# Patient Record
Sex: Male | Born: 2000 | Race: Black or African American | Hispanic: No | Marital: Single
Health system: Southern US, Community
[De-identification: ages and names within clinical notes are randomized; demographics above are authoritative.]

---
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
## 2023-01-05 ENCOUNTER — Ambulatory Visit
Admission: EM | Admit: 2023-01-05 | Discharge: 2023-01-05 | Disposition: A | Discharge status: Home / Self Care | Payer: Medicaid Other | Attending: Nurse Practitioner | Admitting: Nurse Practitioner

## 2023-01-05 DIAGNOSIS — Z202 Contact with and (suspected) exposure to infections with a predominantly sexual mode of transmission: Secondary | ICD-10-CM | POA: Diagnosis present

## 2023-01-05 DIAGNOSIS — Z113 Encounter for screening for infections with a predominantly sexual mode of transmission: Secondary | ICD-10-CM | POA: Diagnosis present

## 2023-01-05 MED ORDER — DOXYCYCLINE HYCLATE 100 MG PO CAPS: 100.0000 mg | ORAL_CAPSULE | Freq: Two times a day (BID) | ORAL | 0 refills | Status: AC

## 2023-01-05 NOTE — Discharge Instructions (Signed)
Start doxycycline to daily for 7 days Clinical contact you with results of the STD testing done are positive Follow-up with your PCP in 2 days for recheck Please go to the emergency room if you have any worsening symptoms

## 2023-01-05 NOTE — ED Provider Notes (Signed)
[facility]    CSN: [id number] Arrival date & time: 01/05/23  1911      History   Chief Complaint Chief Complaint  Patient presents with   Exposure to STD    HPI Daniel Mosley is a 22 y.o. male for evaluation of exposure to chlamydia.  Patient states he was recently informed he was exposed to chlamydia.  He currently denies any symptoms including penile discharge, dysuria, testicular pain or swelling.  He has not been treated for chlamydia in the past.  No other concerns at this time.   Exposure to STD    History reviewed. No pertinent past medical history.  There are no problems to display for this patient.   History reviewed. No pertinent surgical history.     Home Medications    Prior to Admission medications   Medication Sig Start Date End Date Taking? Authorizing Provider  doxycycline (VIBRAMYCIN) 100 MG capsule Take 1 capsule (100 mg total) by mouth 2 (two) times daily for 7 days. 01/05/23 01/12/23 Yes Melynda Ripple, NP  mupirocin ointment (BACTROBAN) 2 % Apply 1 application topically 2 (two) times daily. 06/26/20   Vanessa Kick, MD  diphenhydrAMINE (BENADRYL) 25 MG tablet Take 1 tablet (25 mg total) by mouth every 6 (six) hours as needed. 10/06/19 06/26/20  Brent Bulla, MD    Family History Family History  Problem Relation Age of Onset   Healthy Mother    Healthy Father     Social History Social History   Tobacco Use   Smoking status: Passive Smoke Exposure - Never Smoker   Smokeless tobacco: Never  Vaping Use   Vaping Use: Never used  Substance Use Topics   Alcohol use: No   Drug use: Never     Allergies   Patient has no known allergies.   Review of Systems Review of Systems  Genitourinary:        STD testing     Physical Exam Triage Vital Signs ED Triage Vitals  Enc Vitals Group     BP 01/05/23 1924 127/77     Pulse Rate 01/05/23 1924 83     Resp 01/05/23 1924 20     Temp 01/05/23 1924 98 F (36.7 C)     Temp Source  01/05/23 1924 Oral     SpO2 01/05/23 1924 96 %     Weight 01/05/23 1923 195 lb (88.5 kg)     Height 01/05/23 1923 5\' 9"  (1.753 m)     Head Circumference --      Peak Flow --      Pain Score 01/05/23 1923 0     Pain Loc --      Pain Edu? --      Excl. in [place]? --    No data found.  Updated Vital Signs BP 127/77 (BP Location: Right Arm)   Pulse 83   Temp 98 F (36.7 C) (Oral)   Resp 20   Ht 5\' 9"  (1.753 m)   Wt 195 lb (88.5 kg)   SpO2 96%   BMI 28.80 kg/m   Visual Acuity Right Eye Distance:   Left Eye Distance:   Bilateral Distance:    Right Eye Near:   Left Eye Near:    Bilateral Near:     Physical Exam Vitals and nursing note reviewed.  Constitutional:      Appearance: Normal appearance.  HENT:     Head: Normocephalic and atraumatic.  Eyes:     Pupils: Pupils  are equal, round, and reactive to light.  Cardiovascular:     Rate and Rhythm: Normal rate.  Pulmonary:     Effort: Pulmonary effort is normal.  Skin:    General: Skin is warm and dry.  Neurological:     General: No focal deficit present.     Mental Status: He is alert and oriented to person, place, and time.  Psychiatric:        Mood and Affect: Mood normal.        Behavior: Behavior normal.      UC Treatments / Results  Labs (all labs ordered are listed, but only abnormal results are displayed) Labs Reviewed  CYTOLOGY, (ORAL, ANAL, URETHRAL) ANCILLARY ONLY    EKG   Radiology No results found.  Procedures Procedures (including critical care time)  Medications Ordered in UC Medications - No data to display  Initial Impression / Assessment and Plan / UC Course  I have reviewed the triage vital signs and the nursing notes.  Pertinent labs & imaging results that were available during my care of the patient were reviewed by me and considered in my medical decision making (see chart for details).     Start doxycycline 100 mg twice daily for 7 days given exposure to chlamydia STD  testing is ordered and will contact for any positive results.  Patient declined blood work Follow-up with PCP 2 to 3 days as needed ER precautions reviewed and patient verbalized understanding Final Clinical Impressions(s) / UC Diagnoses   Final diagnoses:  Exposure to chlamydia  Screening examination for STD (sexually transmitted disease)     Discharge Instructions      Start doxycycline to daily for 7 days Clinical contact you with results of the STD testing done are positive Follow-up with your PCP in 2 days for recheck Please go to the emergency room if you have any worsening symptoms   ED Prescriptions     Medication Sig Dispense Auth. Provider   doxycycline (VIBRAMYCIN) 100 MG capsule Take 1 capsule (100 mg total) by mouth 2 (two) times daily for 7 days. 14 capsule Melynda Ripple, NP      PDMP not reviewed this encounter.   Melynda Ripple, NP 01/05/23 [phone]

## 2023-01-05 NOTE — ED Triage Notes (Signed)
Pt states that he was exposed to std.

## 2023-01-06 LAB — CYTOLOGY, (ORAL, ANAL, URETHRAL) ANCILLARY ONLY
Chlamydia: POSITIVE — AB
Comment: NEGATIVE
Comment: NEGATIVE
Comment: NORMAL
Neisseria Gonorrhea: NEGATIVE
Trichomonas: NEGATIVE

## 2023-02-02 ENCOUNTER — Ambulatory Visit
Admission: EM | Admit: 2023-02-02 | Discharge: 2023-02-02 | Disposition: A | Discharge status: Home / Self Care | Payer: Medicaid Other | Attending: Internal Medicine | Admitting: Internal Medicine

## 2023-02-02 DIAGNOSIS — Z8619 Personal history of other infectious and parasitic diseases: Secondary | ICD-10-CM

## 2023-02-02 DIAGNOSIS — R197 Diarrhea, unspecified: Secondary | ICD-10-CM | POA: Diagnosis present

## 2023-02-02 DIAGNOSIS — R519 Headache, unspecified: Secondary | ICD-10-CM

## 2023-02-02 MED ORDER — DOXYCYCLINE HYCLATE 100 MG PO CAPS: 100.0000 mg | ORAL_CAPSULE | Freq: Two times a day (BID) | ORAL | 0 refills | Status: AC

## 2023-02-02 MED ORDER — LOPERAMIDE HCL 2 MG PO CAPS: 2.0000 mg | ORAL_CAPSULE | Freq: Four times a day (QID) | ORAL | 0 refills | Status: AC | PRN

## 2023-02-02 NOTE — Discharge Instructions (Addendum)
You were given a prescription for Doxycycline. This is an antibiotic often used to treated STDs. Your swabs were sent to the lab for further testing.  You will be called with results. Take the prescription as directed. You should avoid all sexual activity until you have been notified of all your results and have undergone any necessary treatment.  If you are positive, it is recommended that you inform all sexual partners so they can treat be treated as well.  Return in 2 to 3 days if no improvement.   You were also given Imodium for diarrhea. Take as directed. The antibiotic can cause you to have more loose bowel movements as well. Recommend that you increase oral fluids such as water and Pedialyte. A bland diet is recommend at this time such as the B.R.A.T diet (Broth, Rice, Applesauce, Toast) as tolerated. Return in 2 to 3 days if no improvement. If new or worsening symptoms such as persistent nausea/vomiting, diarrhea, abdominal pain, fevers develop, go directly to the ER.

## 2023-02-02 NOTE — ED Triage Notes (Signed)
Pt presents with c/o headaches and diarrhea x 3 days.   Pt states he has taken tylenol and drinking water.

## 2023-02-02 NOTE — ED Provider Notes (Signed)
[facility]    CSN: [id number] Arrival date & time: 02/02/23  1647  History   Chief Complaint Chief Complaint  Patient presents with   Headache   Diarrhea   SEXUALLY TRANSMITTED DISEASE   HPI Daniel Mosley is a 22 y.o. male presenting for diarrhea and STD treatment.  Patient states that he was seen in our office a month ago and was positive for chlamydia at that time.  He was sent a prescription for doxycycline for treatment, but only took 2 days worth of medication.  The patient states he dropped the antibiotic on the floor and could not complete the entire course.  Denies fever, penile discharge, or penile lesions.  Patient also presents for diarrhea for 1 day.  He reports 3 loose stools today with no blood.  Denies fever, nausea, vomiting, and abdominal pain.  Sick contact over the weekend.  Endorses headache. Presents NAD.  History reviewed. No pertinent past medical history.  There are no problems to display for this patient.  History reviewed. No pertinent surgical history.   Home Medications    Prior to Admission medications   Medication Sig Start Date End Date Taking? Authorizing Provider  loperamide (IMODIUM) 2 MG capsule Take 1 capsule (2 mg total) by mouth 4 (four) times daily as needed for diarrhea or loose stools. 02/02/23  Yes Vera Wishart P, PA-C  mupirocin ointment (BACTROBAN) 2 % Apply 1 application topically 2 (two) times daily. 06/26/20   Vanessa Kick, MD  diphenhydrAMINE (BENADRYL) 25 MG tablet Take 1 tablet (25 mg total) by mouth every 6 (six) hours as needed. 10/06/19 06/26/20  Brent Bulla, MD    Family History Family History  Problem Relation Age of Onset   Healthy Mother    Healthy Father     Social History Social History   Tobacco Use   Smoking status: Passive Smoke Exposure - Never Smoker   Smokeless tobacco: Never  Vaping Use   Vaping Use: Never used  Substance Use Topics   Alcohol use: No   Drug use: Never    Allergies    Patient has no known allergies.   Review of Systems Review of Systems  Constitutional:  Negative for chills and fever.  HENT:  Negative for sore throat.   Respiratory:  Negative for cough.   Gastrointestinal:  Positive for diarrhea. Negative for abdominal pain, blood in stool, nausea and vomiting.  Genitourinary:  Negative for genital sores and penile discharge.  Neurological:  Positive for headaches.    Physical Exam Triage Vital Signs ED Triage Vitals  Enc Vitals Group     BP 02/02/23 1656 (!) 135/59     Pulse Rate 02/02/23 1655 97     Resp 02/02/23 1655 18     Temp 02/02/23 1655 99 F (37.2 C)     Temp Source 02/02/23 1655 Oral     SpO2 02/02/23 1655 93 %     Weight --      Height --      Head Circumference --      Peak Flow --      Pain Score 02/02/23 1654 0     Pain Loc --      Pain Edu? --      Excl. in [place]? --    Updated Vital Signs BP 114/73 (BP Location: Right Arm)   Pulse 86   Temp 99 F (37.2 C)   Resp 18   SpO2 96%   Physical Exam Constitutional:  General: He is not in acute distress.    Appearance: Normal appearance. He is well-developed and overweight. He is not ill-appearing or toxic-appearing.  HENT:     Head: Normocephalic and atraumatic.     Mouth/Throat:     Pharynx: Oropharynx is clear. Uvula midline. No posterior oropharyngeal erythema.     Tonsils: No tonsillar exudate or tonsillar abscesses.  Cardiovascular:     Rate and Rhythm: Normal rate and regular rhythm.     Heart sounds: Normal heart sounds.  Pulmonary:     Effort: Pulmonary effort is normal.     Breath sounds: Normal breath sounds.     Comments: Clear to auscultation bilaterally  Abdominal:     General: Bowel sounds are normal.     Palpations: Abdomen is soft.     Tenderness: There is no abdominal tenderness.  Skin:    General: Skin is warm and dry.  Neurological:     General: No focal deficit present.     Mental Status: He is alert.  Psychiatric:        Mood and  Affect: Mood and affect normal.    UC Treatments / Results  Labs (all labs ordered are listed, but only abnormal results are displayed) Labs Reviewed  CYTOLOGY, (ORAL, ANAL, URETHRAL) ANCILLARY ONLY   Medications Ordered in UC Medications - No data to display  Initial Impression / Assessment and Plan / UC Course  I have reviewed the triage vital signs and the nursing notes.  Pertinent labs & imaging results that were available during my care of the patient were reviewed by me and considered in my medical decision making (see chart for details).  History of Chlamydia:  Afebrile, nontoxic-appearing, NAD. VSS. History of chlamydia 1 month ago, but patient reports never completing course of antibiotics.  Rx sent for another course of doxycycline.  Cytology was sent to lab for further testing.  Patient refused HIV and RPR testing today.  Patient was advised that antibiotics are likely to worsen his current diarrhea.  Recommend increase oral fluids and probiotics. Strict ED precautions were given and patient verbalized understanding.  Diarrhea: DDX includes but not limited to: viral etiology versus bacterial etiology Patient was provided with an RX for Imodium. Patient was advised that antibiotics are likely to worsen his current diarrhea.  Recommend increase oral fluids and probiotics. Return in 2 to 3 days if no improvement. Strict ED precautions were given and patient verbalized understanding.  Headache: DDX includes but not limited to: secondary to dehydration, COVID, flu Recommend increase oral fluids and Tylenol PRN. Strict ED precautions were given and patient verbalized understanding.  Final Clinical Impressions(s) / UC Diagnoses   Final diagnoses:  History of chlamydia infection  Acute nonintractable headache, unspecified headache type  Diarrhea, unspecified type     Discharge Instructions      You were given a prescription for Doxycycline. This is an antibiotic often used  to treated STDs. Your swabs were sent to the lab for further testing.  You will be called with results. Take the prescription as directed. You should avoid all sexual activity until you have been notified of all your results and have undergone any necessary treatment.  If you are positive, it is recommended that you inform all sexual partners so they can treat be treated as well.  Return in 2 to 3 days if no improvement.   You were also given Imodium for diarrhea. Take as directed. The antibiotic can cause you to have more  loose bowel movements as well. Recommend that you increase oral fluids such as water and Pedialyte. A bland diet is recommend at this time such as the B.R.A.T diet (Broth, Rice, Applesauce, Toast) as tolerated. Return in 2 to 3 days if no improvement. If new or worsening symptoms such as persistent nausea/vomiting, diarrhea, abdominal pain, fevers develop, go directly to the ER.      ED Prescriptions     Medication Sig Dispense Auth. Provider   loperamide (IMODIUM) 2 MG capsule Take 1 capsule (2 mg total) by mouth 4 (four) times daily as needed for diarrhea or loose stools. 12 capsule Shamell Suarez P, PA-C   doxycycline (VIBRAMYCIN) 100 MG capsule Take 1 capsule (100 mg total) by mouth 2 (two) times daily for 7 days. 14 capsule Amen Dargis P, PA-C      PDMP not reviewed this encounter.   Carmie End, PA-C 02/02/23 1750

## 2023-02-02 NOTE — ED Triage Notes (Signed)
Pt states he would like more of the antibiotics prescribed from his previous positive test to Chlamydia.

## 2023-02-03 LAB — CYTOLOGY, (ORAL, ANAL, URETHRAL) ANCILLARY ONLY
Chlamydia: POSITIVE — AB
Comment: NEGATIVE
Comment: NEGATIVE
Comment: NORMAL
Neisseria Gonorrhea: NEGATIVE
Trichomonas: NEGATIVE
